# Patient Record
Sex: Male | Born: 1985 | Race: White | Hispanic: No | Marital: Single | Smoking: Current every day smoker
Health system: Southern US, Community
[De-identification: ages and names within clinical notes are randomized; demographics above are authoritative.]

## PROBLEM LIST (undated history)

## (undated) HISTORY — PX: TONSILLECTOMY: SUR1361

---

## 2010-12-14 ENCOUNTER — Emergency Department (HOSPITAL_COMMUNITY)
Admission: EM | Admit: 2010-12-14 | Discharge: 2010-12-14 | Disposition: A | Discharge status: Home / Self Care | Payer: Self-pay | Attending: Emergency Medicine | Admitting: Emergency Medicine

## 2010-12-14 DIAGNOSIS — W1809XA Striking against other object with subsequent fall, initial encounter: Secondary | ICD-10-CM | POA: Insufficient documentation

## 2010-12-14 DIAGNOSIS — R51 Headache: Secondary | ICD-10-CM | POA: Insufficient documentation

## 2010-12-14 DIAGNOSIS — R42 Dizziness and giddiness: Secondary | ICD-10-CM | POA: Insufficient documentation

## 2010-12-14 DIAGNOSIS — S0180XA Unspecified open wound of other part of head, initial encounter: Secondary | ICD-10-CM | POA: Insufficient documentation

## 2010-12-14 DIAGNOSIS — S0990XA Unspecified injury of head, initial encounter: Secondary | ICD-10-CM | POA: Insufficient documentation

## 2010-12-14 DIAGNOSIS — Y92009 Unspecified place in unspecified non-institutional (private) residence as the place of occurrence of the external cause: Secondary | ICD-10-CM | POA: Insufficient documentation

## 2010-12-14 DIAGNOSIS — S01501A Unspecified open wound of lip, initial encounter: Secondary | ICD-10-CM | POA: Insufficient documentation

## 2010-12-14 DIAGNOSIS — S0120XA Unspecified open wound of nose, initial encounter: Secondary | ICD-10-CM | POA: Insufficient documentation

## 2012-02-16 ENCOUNTER — Encounter (HOSPITAL_COMMUNITY): Payer: Self-pay | Admitting: Emergency Medicine

## 2012-02-16 ENCOUNTER — Emergency Department (HOSPITAL_COMMUNITY)
Admission: EM | Admit: 2012-02-16 | Discharge: 2012-02-16 | Disposition: A | Discharge status: Home / Self Care | Payer: Managed Care, Other (non HMO) | Attending: Emergency Medicine | Admitting: Emergency Medicine

## 2012-02-16 ENCOUNTER — Emergency Department (HOSPITAL_COMMUNITY): Payer: Managed Care, Other (non HMO)

## 2012-02-16 DIAGNOSIS — R110 Nausea: Secondary | ICD-10-CM | POA: Insufficient documentation

## 2012-02-16 DIAGNOSIS — H53149 Visual discomfort, unspecified: Secondary | ICD-10-CM | POA: Insufficient documentation

## 2012-02-16 DIAGNOSIS — R0602 Shortness of breath: Secondary | ICD-10-CM | POA: Insufficient documentation

## 2012-02-16 DIAGNOSIS — G43909 Migraine, unspecified, not intractable, without status migrainosus: Secondary | ICD-10-CM | POA: Insufficient documentation

## 2012-02-16 DIAGNOSIS — F17200 Nicotine dependence, unspecified, uncomplicated: Secondary | ICD-10-CM | POA: Insufficient documentation

## 2012-02-16 DIAGNOSIS — M7918 Myalgia, other site: Secondary | ICD-10-CM

## 2012-02-16 DIAGNOSIS — IMO0001 Reserved for inherently not codable concepts without codable children: Secondary | ICD-10-CM | POA: Insufficient documentation

## 2012-02-16 LAB — POCT I-STAT TROPONIN I: Troponin i, poc: 0.01 ng/mL (ref 0.00–0.08)

## 2012-02-16 MED ORDER — OXYCODONE-ACETAMINOPHEN 5-325 MG PO TABS: 1.0000 | ORAL_TABLET | ORAL | Status: AC | PRN

## 2012-02-16 MED ORDER — KETOROLAC TROMETHAMINE 30 MG/ML IJ SOLN
30.0000 mg | Freq: Once | INTRAMUSCULAR | Status: AC
  Administered 2012-02-16: 30 mg via INTRAVENOUS
  Filled 2012-02-16 (×2): qty 1

## 2012-02-16 MED ORDER — DIPHENHYDRAMINE HCL 50 MG/ML IJ SOLN
25.0000 mg | Freq: Once | INTRAMUSCULAR | Status: AC
  Administered 2012-02-16: 25 mg via INTRAVENOUS
  Filled 2012-02-16 (×2): qty 1

## 2012-02-16 MED ORDER — METOCLOPRAMIDE HCL 5 MG/ML IJ SOLN
10.0000 mg | Freq: Once | INTRAMUSCULAR | Status: AC
  Administered 2012-02-16: 10 mg via INTRAVENOUS
  Filled 2012-02-16: qty 2

## 2012-02-16 MED ORDER — IBUPROFEN 600 MG PO TABS: 600.0000 mg | ORAL_TABLET | Freq: Four times a day (QID) | ORAL | Status: AC | PRN

## 2012-02-16 NOTE — ED Provider Notes (Signed)
History     CSN: [id number]  Arrival date & time 02/16/12  5784   First MD Initiated Contact with Patient 02/16/12 1001      Chief Complaint  Patient presents with  . Chest Pain    (Consider location/radiation/quality/duration/timing/severity/associated sxs/prior treatment) Patient is a 25 y.o. male presenting with chest pain and migraines. The history is provided by the patient and a friend. No language interpreter was used.  Chest Pain The chest pain began 2 days ago. At its most intense, the pain is at 8/10. The pain is currently at 4/10. The quality of the pain is described as heavy, pressure-like and sharp. The pain does not radiate. Primary symptoms include shortness of breath and nausea. Pertinent negatives for primary symptoms include no fever, no cough, no abdominal pain and no vomiting.    Migraine This is a recurrent problem. The current episode started yesterday. The problem occurs constantly. The problem has been unchanged. Associated symptoms include chest pain and nausea. Pertinent negatives include no abdominal pain, congestion, coughing, fever, sore throat, swollen glands, visual change or vomiting. The symptoms are aggravated by bending. Treatments tried: Excedrin. The treatment provided mild relief.   26 year old male coming in with complaints of midsternal chest pain that is sharp and upper chest pain that is pressure-like x2 days and reproducible since he had the Heimlich maneuver performed on him with 3 compressions. Also complaining of paraspinal thoracic pain that is reproducible. States that he was at work and had a piece of bread stuck in his throat when his coworker performed the Heimlich.    Patient also complaining of a migraine headache typical to his migraines he said in the past. Pain is bilateral for head. He took Excedrin yesterday with no relief. He has photophobia and nausea today with his headache.  History reviewed. No pertinent past medical  history.  Past Surgical History  Procedure Date  . Tonsillectomy     No family history on file.  History  Substance Use Topics  . Smoking status: Current Every Day Smoker  . Smokeless tobacco: Not on file  . Alcohol Use: Yes      Review of Systems  Constitutional: Negative.  Negative for fever.  HENT: Negative.  Negative for congestion and sore throat.   Eyes: Positive for photophobia. Negative for visual disturbance.  Respiratory: Positive for shortness of breath. Negative for cough.   Cardiovascular: Positive for chest pain.  Gastrointestinal: Positive for nausea. Negative for vomiting and abdominal pain.  Neurological: Negative.   Psychiatric/Behavioral: Negative.   All other systems reviewed and are negative.    Allergies  Flexeril  Home Medications   Current Outpatient Rx  Name  Route  Sig  Dispense  Refill  . ASPIRIN-ACETAMINOPHEN-CAFFEINE 250-250-65 MG PO TABS   Oral   Take 2 tablets by mouth every 6 (six) hours as needed. Migraine           BP 134/70  Pulse 94  Temp 98.7 F (37.1 C) (Oral)  Resp 13  SpO2 100%  Physical Exam  Nursing note and vitals reviewed. Constitutional: He is oriented to person, place, and time. He appears well-developed and well-nourished.  HENT:  Head: Normocephalic.  Eyes: Conjunctivae normal and EOM are normal. Pupils are equal, round, and reactive to light.  Neck: Normal range of motion. Neck supple.  Cardiovascular: Normal rate.   Pulmonary/Chest: Effort normal and breath sounds normal. No respiratory distress. He has no wheezes. He has no rales.  Abdominal: Soft.  Musculoskeletal:  Normal range of motion. He exhibits tenderness.       Tenderness to the sternum and upper chest.  Tenderness to paraspinal thoracic  Neurological: He is alert and oriented to person, place, and time.  Skin: Skin is warm and dry.  Psychiatric: He has a normal mood and affect.    ED Course  Procedures (including critical care  time)  Labs Reviewed - No data to display No results found.   No diagnosis found.    MDM  Musculoskeletal pain after heimlich maneuver 2 days ago.  Also treated with migraine cocktail in the ER for his migraine h/a.  Patient better after meds in the ER. EKG unremarkable and trop -. Chest x-ray unremarkable and reviewed by myself.  Understands to return to the ER for severe pain or SOB.  He will follow up with his pcp as needed. rx for 6 percocet. Patient and friend are ready for discharge.     Date: 02/16/2012  Rate: 98  Rhythm: normal sinus rhythm  QRS Axis: right  Intervals: normal  ST/T Wave abnormalities: normal  Conduction Disutrbances:none  Narrative Interpretation:   Old EKG Reviewed: none available  Labs Reviewed  POCT I-STAT TROPONIN I          Remi Haggard, NP 02/16/12 1126

## 2012-02-16 NOTE — ED Notes (Signed)
Per patient, had himlic done on Monday, since then he has had increased sternal chest pain, as well as back pain

## 2012-02-18 NOTE — ED Provider Notes (Signed)
Medical screening examination/treatment/procedure(s) were performed by non-physician practitioner and as supervising physician I was immediately available for consultation/collaboration.  Somer Trotter R. Jakayla Schweppe, MD 02/18/12 1054 

## 2012-02-29 ENCOUNTER — Emergency Department: Payer: Self-pay | Admitting: Emergency Medicine

## 2012-02-29 LAB — HEPATIC FUNCTION PANEL A (ARMC)
Alkaline Phosphatase: 72 U/L (ref 50–136)
Bilirubin, Direct: 0.1 mg/dL (ref 0.00–0.20)
SGOT(AST): 35 U/L (ref 15–37)
SGPT (ALT): 30 U/L (ref 12–78)

## 2012-02-29 LAB — URINALYSIS, COMPLETE
Bacteria: NONE SEEN
Bilirubin,UR: NEGATIVE
Blood: NEGATIVE
Glucose,UR: NEGATIVE mg/dL (ref 0–75)
Ketone: NEGATIVE
Leukocyte Esterase: NEGATIVE
RBC,UR: 1 /HPF (ref 0–5)
Squamous Epithelial: 1

## 2012-02-29 LAB — CBC
HCT: 54.6 % — ABNORMAL HIGH (ref 40.0–52.0)
MCH: 31 pg (ref 26.0–34.0)
MCV: 92 fL (ref 80–100)
Platelet: 238 10*3/uL (ref 150–440)
RDW: 13.5 % (ref 11.5–14.5)

## 2012-02-29 LAB — BASIC METABOLIC PANEL
Anion Gap: 3 — ABNORMAL LOW (ref 7–16)
BUN: 13 mg/dL (ref 7–18)
Co2: 27 mmol/L (ref 21–32)
Creatinine: 1.06 mg/dL (ref 0.60–1.30)
EGFR (African American): >60
Osmolality: 278 (ref 275–301)

## 2012-02-29 LAB — DIFFERENTIAL
Basophil #: 0.1 10*3/uL (ref 0.0–0.1)
Basophil %: 0.6 %
Eosinophil #: 0.1 10*3/uL (ref 0.0–0.7)
Lymphocyte #: 0.5 10*3/uL — ABNORMAL LOW (ref 1.0–3.6)
Monocyte %: 2.1 %
Neutrophil #: 19.5 10*3/uL — ABNORMAL HIGH (ref 1.4–6.5)

## 2012-02-29 LAB — LIPASE, BLOOD: Lipase: 205 U/L (ref 73–393)

## 2012-04-11 LAB — COMPREHENSIVE METABOLIC PANEL
BUN: 15 mg/dL (ref 7–18)
Calcium, Total: 9.3 mg/dL (ref 8.5–10.1)
Co2: 23 mmol/L (ref 21–32)
Potassium: 3.7 mmol/L (ref 3.5–5.1)
SGPT (ALT): 20 U/L (ref 12–78)
Sodium: 139 mmol/L (ref 136–145)

## 2012-04-11 LAB — URINALYSIS, COMPLETE
Leukocyte Esterase: NEGATIVE
Nitrite: NEGATIVE
RBC,UR: 1 /HPF (ref 0–5)
Specific Gravity: 1.031 (ref 1.003–1.030)
Squamous Epithelial: NONE SEEN

## 2012-04-11 LAB — CBC
HCT: 51.7 % (ref 40.0–52.0)
HGB: 19 g/dL — ABNORMAL HIGH (ref 13.0–18.0)
MCHC: 36.7 g/dL — ABNORMAL HIGH (ref 32.0–36.0)
MCV: 88 fL (ref 80–100)
RDW: 13 % (ref 11.5–14.5)

## 2012-04-12 ENCOUNTER — Observation Stay: Payer: Self-pay | Admitting: Surgery

## 2012-04-13 LAB — COMPREHENSIVE METABOLIC PANEL
Anion Gap: 5 — ABNORMAL LOW (ref 7–16)
BUN: 7 mg/dL (ref 7–18)
Bilirubin,Total: 0.4 mg/dL (ref 0.2–1.0)
Calcium, Total: 8.2 mg/dL — ABNORMAL LOW (ref 8.5–10.1)
Chloride: 111 mmol/L — ABNORMAL HIGH (ref 98–107)
EGFR (African American): >60
EGFR (Non-African Amer.): >60
Glucose: 76 mg/dL (ref 65–99)
Potassium: 4.1 mmol/L (ref 3.5–5.1)
SGOT(AST): 22 U/L (ref 15–37)
Total Protein: 6.1 g/dL — ABNORMAL LOW (ref 6.4–8.2)

## 2012-04-13 LAB — CBC WITH DIFFERENTIAL/PLATELET
Eosinophil %: 9.6 %
Lymphocyte %: 49.1 %
MCHC: 35.7 g/dL (ref 32.0–36.0)
Monocyte #: 0.5 x10 3/mm (ref 0.2–1.0)
RDW: 12.9 % (ref 11.5–14.5)

## 2012-04-14 LAB — BASIC METABOLIC PANEL
Anion Gap: 5 — ABNORMAL LOW (ref 7–16)
Calcium, Total: 8.5 mg/dL (ref 8.5–10.1)

## 2012-04-14 LAB — CBC WITH DIFFERENTIAL/PLATELET
Basophil %: 1.3 %
Eosinophil #: 0.4 10*3/uL (ref 0.0–0.7)
HCT: 42.5 % (ref 40.0–52.0)
Lymphocyte %: 40.6 %
Monocyte #: 0.7 x10 3/mm (ref 0.2–1.0)
Monocyte %: 13.2 %
Neutrophil #: 2.1 10*3/uL (ref 1.4–6.5)
Neutrophil %: 38.3 %
Platelet: 164 10*3/uL (ref 150–440)
RBC: 4.78 10*6/uL (ref 4.40–5.90)

## 2012-04-15 ENCOUNTER — Emergency Department: Payer: Self-pay | Admitting: Emergency Medicine

## 2012-04-16 LAB — CBC
HCT: 47.9 % (ref 40.0–52.0)
MCH: 30.4 pg (ref 26.0–34.0)
MCHC: 34.4 g/dL (ref 32.0–36.0)
MCV: 88 fL (ref 80–100)
Platelet: 195 10*3/uL (ref 150–440)
RBC: 5.41 10*6/uL (ref 4.40–5.90)
RDW: 12.8 % (ref 11.5–14.5)

## 2012-04-16 LAB — URINALYSIS, COMPLETE
Bilirubin,UR: NEGATIVE
Blood: NEGATIVE
Glucose,UR: NEGATIVE mg/dL (ref 0–75)
Ketone: NEGATIVE
Nitrite: NEGATIVE
Protein: NEGATIVE
RBC,UR: 1 /HPF (ref 0–5)
Specific Gravity: 1.02 (ref 1.003–1.030)
Squamous Epithelial: 2

## 2012-04-16 LAB — BASIC METABOLIC PANEL
Chloride: 104 mmol/L (ref 98–107)
Co2: 31 mmol/L (ref 21–32)
Creatinine: 1.28 mg/dL (ref 0.60–1.30)
EGFR (African American): >60
EGFR (Non-African Amer.): >60
Osmolality: 278 (ref 275–301)
Potassium: 4.4 mmol/L (ref 3.5–5.1)
Sodium: 140 mmol/L (ref 136–145)

## 2012-04-16 LAB — DRUG SCREEN, URINE
Cannabinoid 50 Ng, Ur ~~LOC~~: POSITIVE (ref ?–50)
Cocaine Metabolite,Ur ~~LOC~~: POSITIVE (ref ?–300)
MDMA (Ecstasy)Ur Screen: NEGATIVE (ref ?–500)
Opiate, Ur Screen: POSITIVE (ref ?–300)

## 2012-07-09 ENCOUNTER — Emergency Department: Payer: Self-pay | Admitting: Emergency Medicine

## 2012-07-09 LAB — URINALYSIS, COMPLETE
Bacteria: NONE SEEN
Bilirubin,UR: NEGATIVE
Blood: NEGATIVE
Ketone: NEGATIVE
Protein: NEGATIVE
RBC,UR: <1 /HPF (ref 0–5)
Specific Gravity: 1.008 (ref 1.003–1.030)

## 2012-07-09 LAB — COMPREHENSIVE METABOLIC PANEL
Alkaline Phosphatase: 58 U/L (ref 50–136)
Bilirubin,Total: 0.4 mg/dL (ref 0.2–1.0)
Chloride: 108 mmol/L — ABNORMAL HIGH (ref 98–107)
SGPT (ALT): 16 U/L (ref 12–78)
Total Protein: 7.1 g/dL (ref 6.4–8.2)

## 2012-07-09 LAB — CBC
HGB: 15.4 g/dL (ref 13.0–18.0)
MCV: 90 fL (ref 80–100)
Platelet: 183 10*3/uL (ref 150–440)
RBC: 4.82 10*6/uL (ref 4.40–5.90)

## 2012-07-09 LAB — LIPASE, BLOOD: Lipase: 152 U/L (ref 73–393)

## 2012-08-01 ENCOUNTER — Emergency Department: Payer: Self-pay | Admitting: Emergency Medicine

## 2012-08-01 LAB — URINALYSIS, COMPLETE
Glucose,UR: NEGATIVE mg/dL (ref 0–75)
Nitrite: NEGATIVE
Protein: NEGATIVE
RBC,UR: NONE SEEN /HPF (ref 0–5)
Specific Gravity: 1.025 (ref 1.003–1.030)
Squamous Epithelial: <1
WBC UR: 69 /HPF (ref 0–5)

## 2012-08-21 ENCOUNTER — Emergency Department: Payer: Self-pay | Admitting: Unknown Physician Specialty

## 2012-08-21 LAB — URINALYSIS, COMPLETE
Bilirubin,UR: NEGATIVE
Glucose,UR: NEGATIVE mg/dL (ref 0–75)
Ketone: NEGATIVE
Nitrite: NEGATIVE
Protein: NEGATIVE
Specific Gravity: 1.01 (ref 1.003–1.030)
WBC UR: 11 /HPF (ref 0–5)

## 2012-08-21 LAB — LIPASE, BLOOD: Lipase: 563 U/L — ABNORMAL HIGH (ref 73–393)

## 2012-08-21 LAB — BASIC METABOLIC PANEL
BUN: 11 mg/dL (ref 7–18)
Calcium, Total: 8.9 mg/dL (ref 8.5–10.1)
Creatinine: 1.04 mg/dL (ref 0.60–1.30)
EGFR (African American): >60
EGFR (Non-African Amer.): >60
Glucose: 97 mg/dL (ref 65–99)
Osmolality: 277 (ref 275–301)
Potassium: 4 mmol/L (ref 3.5–5.1)
Sodium: 139 mmol/L (ref 136–145)

## 2012-08-21 LAB — CBC
HGB: 15.7 g/dL (ref 13.0–18.0)
Platelet: 193 10*3/uL (ref 150–440)
RBC: 5.01 10*6/uL (ref 4.40–5.90)

## 2012-08-21 LAB — HEPATIC FUNCTION PANEL A (ARMC)
Bilirubin, Direct: <0.1 mg/dL (ref 0.00–0.20)
Bilirubin,Total: 0.3 mg/dL (ref 0.2–1.0)
SGOT(AST): 24 U/L (ref 15–37)

## 2012-08-21 LAB — MAGNESIUM: Magnesium: 1.7 mg/dL — ABNORMAL LOW

## 2012-09-13 ENCOUNTER — Emergency Department: Payer: Self-pay | Admitting: Emergency Medicine

## 2012-09-13 LAB — COMPREHENSIVE METABOLIC PANEL
Bilirubin,Total: 0.4 mg/dL (ref 0.2–1.0)
Calcium, Total: 9.1 mg/dL (ref 8.5–10.1)
Chloride: 106 mmol/L (ref 98–107)
Creatinine: 1.17 mg/dL (ref 0.60–1.30)
EGFR (African American): >60
EGFR (Non-African Amer.): >60
Osmolality: 280 (ref 275–301)
SGOT(AST): 20 U/L (ref 15–37)
Total Protein: 7.4 g/dL (ref 6.4–8.2)

## 2012-09-13 LAB — LIPASE, BLOOD: Lipase: 168 U/L (ref 73–393)

## 2012-09-13 LAB — URINALYSIS, COMPLETE
Glucose,UR: NEGATIVE mg/dL (ref 0–75)
Ketone: NEGATIVE
Ph: 5 (ref 4.5–8.0)
Protein: NEGATIVE
RBC,UR: 17 /HPF (ref 0–5)
Specific Gravity: 1.029 (ref 1.003–1.030)
Squamous Epithelial: 2
WBC UR: 112 /HPF (ref 0–5)

## 2012-09-13 LAB — CBC
HCT: 48.5 % (ref 40.0–52.0)
HGB: 16.8 g/dL (ref 13.0–18.0)
MCH: 31 pg (ref 26.0–34.0)
MCV: 89 fL (ref 80–100)
Platelet: 203 10*3/uL (ref 150–440)
RBC: 5.43 10*6/uL (ref 4.40–5.90)
WBC: 12.5 10*3/uL — ABNORMAL HIGH (ref 3.8–10.6)

## 2012-12-14 ENCOUNTER — Emergency Department: Payer: Self-pay | Admitting: Emergency Medicine

## 2012-12-14 LAB — COMPREHENSIVE METABOLIC PANEL
Co2: 28 mmol/L (ref 21–32)
Creatinine: 0.97 mg/dL (ref 0.60–1.30)
EGFR (African American): >60
EGFR (Non-African Amer.): >60
Potassium: 4.2 mmol/L (ref 3.5–5.1)
Sodium: 139 mmol/L (ref 136–145)

## 2012-12-14 LAB — URINALYSIS, COMPLETE
Bilirubin,UR: NEGATIVE
Blood: NEGATIVE
Glucose,UR: NEGATIVE mg/dL (ref 0–75)
Ketone: NEGATIVE
Nitrite: NEGATIVE
Ph: 6 (ref 4.5–8.0)
Specific Gravity: 1.023 (ref 1.003–1.030)
WBC UR: 94 /HPF (ref 0–5)

## 2012-12-14 LAB — CBC
HCT: 45.3 % (ref 40.0–52.0)
MCHC: 34.6 g/dL (ref 32.0–36.0)
Platelet: 209 10*3/uL (ref 150–440)
RDW: 13.7 % (ref 11.5–14.5)

## 2012-12-16 LAB — URINE CULTURE

## 2013-02-12 ENCOUNTER — Emergency Department: Payer: Self-pay | Admitting: Emergency Medicine

## 2013-07-20 ENCOUNTER — Emergency Department: Payer: Self-pay | Admitting: Emergency Medicine

## 2013-07-20 LAB — URINALYSIS, COMPLETE
Bilirubin,UR: NEGATIVE
Blood: NEGATIVE
Glucose,UR: NEGATIVE mg/dL (ref 0–75)
Ketone: NEGATIVE
Nitrite: NEGATIVE
Ph: 5 (ref 4.5–8.0)
Protein: NEGATIVE
RBC,UR: 2 /HPF (ref 0–5)
SPECIFIC GRAVITY: 1.018 (ref 1.003–1.030)
WBC UR: 10 /HPF (ref 0–5)

## 2013-08-27 IMAGING — CT CT ABD-PELV W/ CM
1 of 2 series · 15 of 32 positions shown, 19 images · non-contrast
Comparison: none

REASON FOR EXAM: (1) ABD PAIN, VOMITING; (2) H/O INTUSSECEPTION;    NOTE:
Nursing to Give Oral CT
COMMENTS:   May transport without cardiac monitor

[Series 2: 3mm soft tissue · axial · 0.68mm/px · z∈[-546,-128]mm · 15 of 153 slices shown, 19 images]
[im 7/153  soft-tissue]
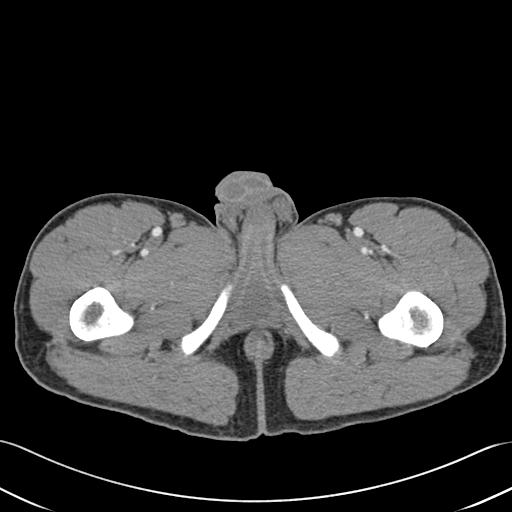
[im 7/153  bone]
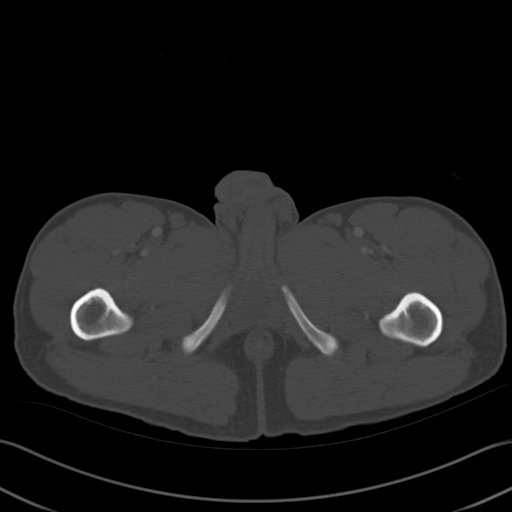
[im 20/153  soft-tissue]
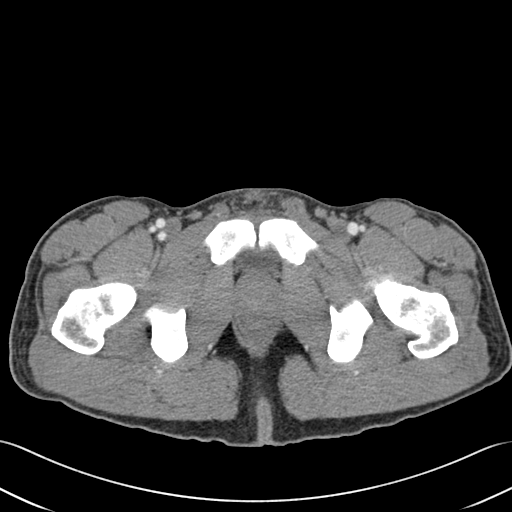
[im 34/153  soft-tissue]
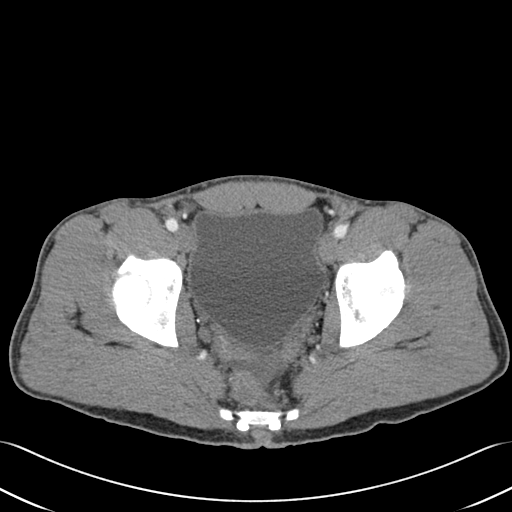
[im 40/153  soft-tissue]
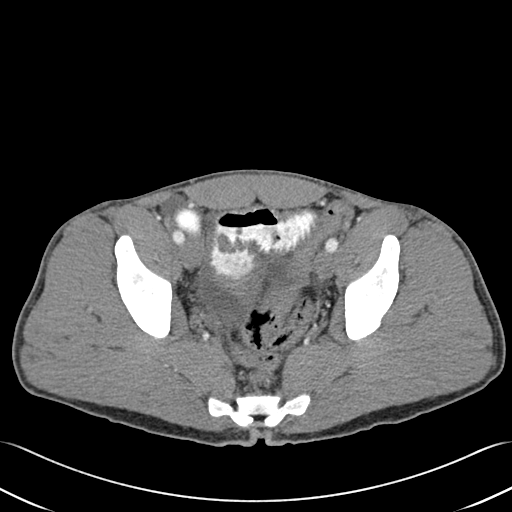
[im 53/153  soft-tissue]
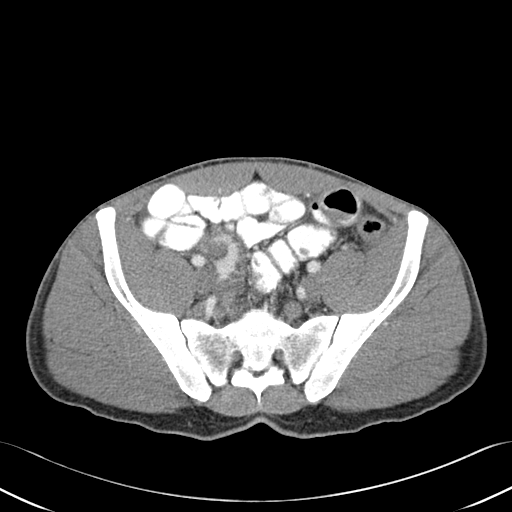
[im 67/153  soft-tissue]
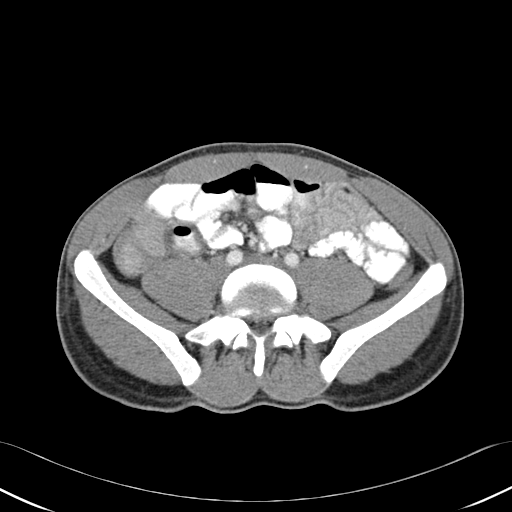
[im 80/153  soft-tissue]
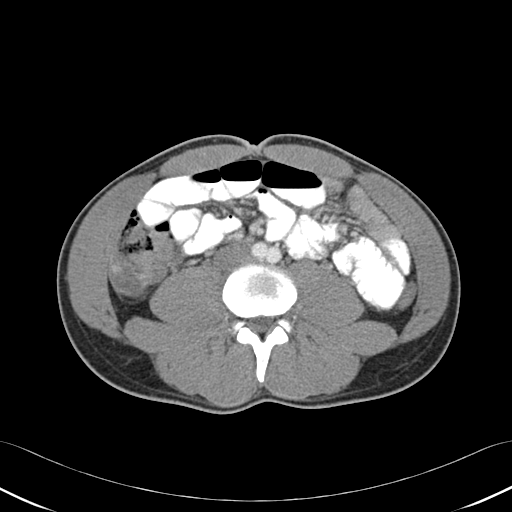
[im 86/153  soft-tissue]
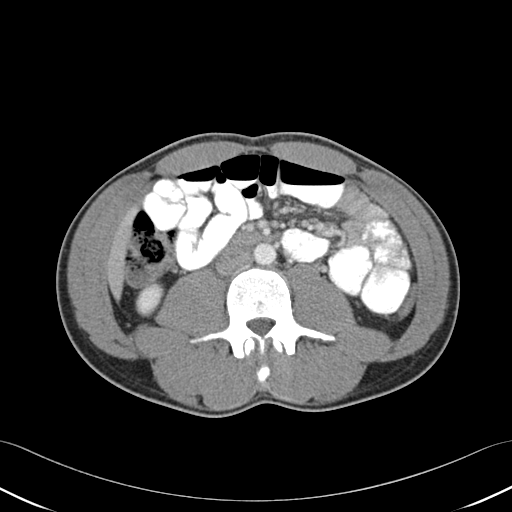
[im 100/153  soft-tissue]
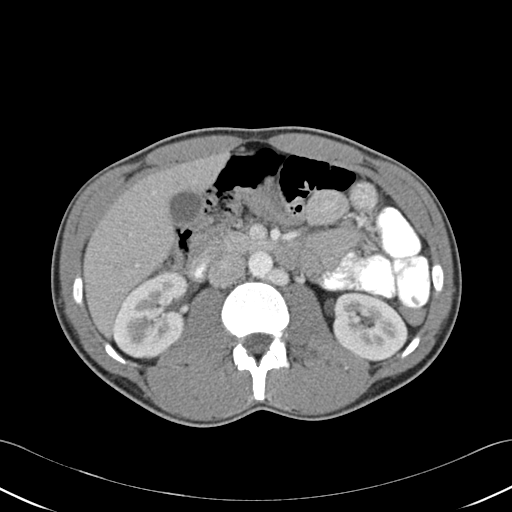
[im 100/153  bone]
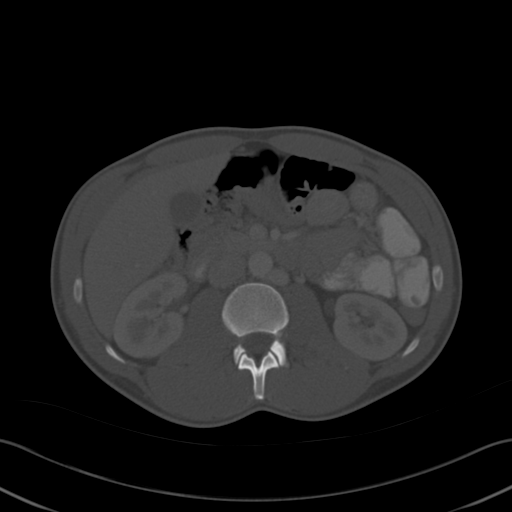
[im 113/153  soft-tissue]
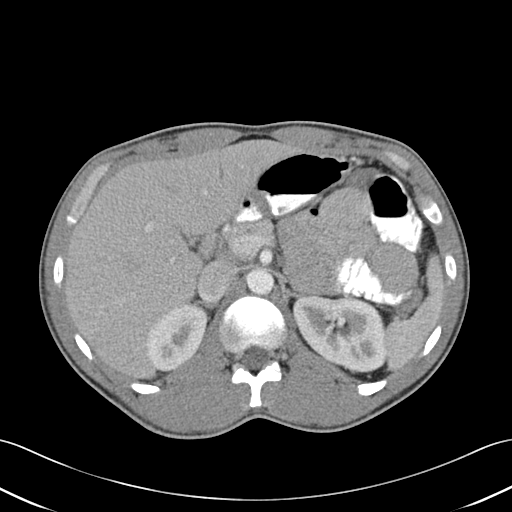
[im 119/153  soft-tissue]
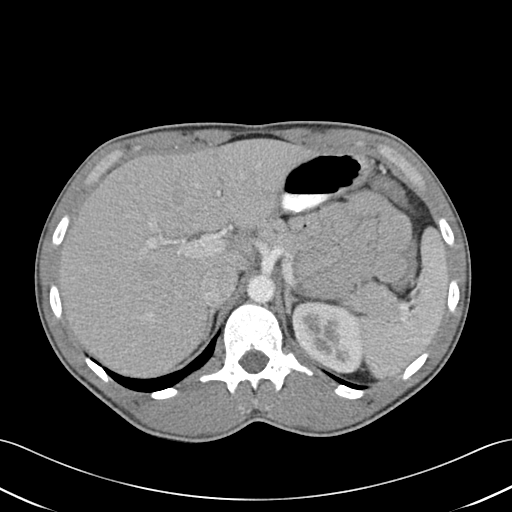
[im 126/153  lung]
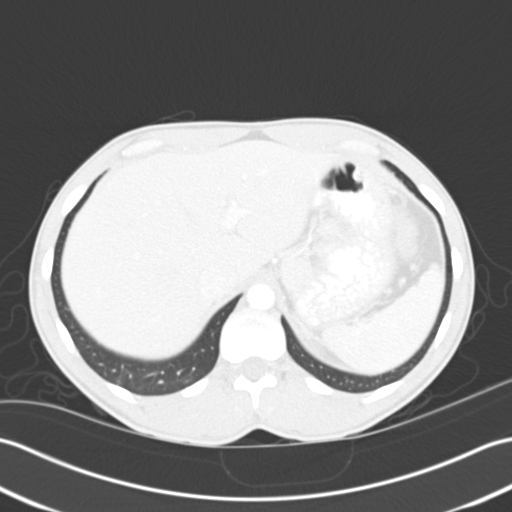
[im 133/153  soft-tissue]
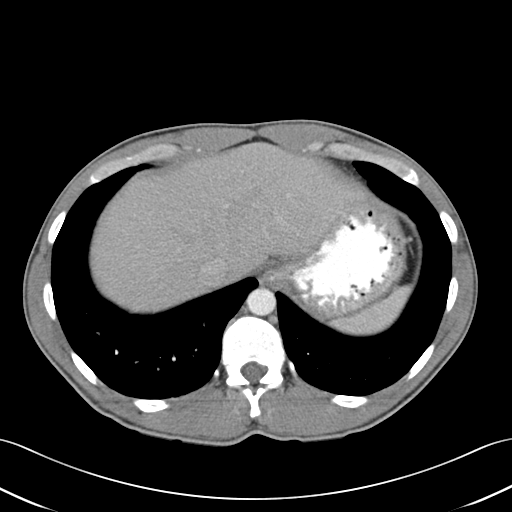
[im 133/153  lung]
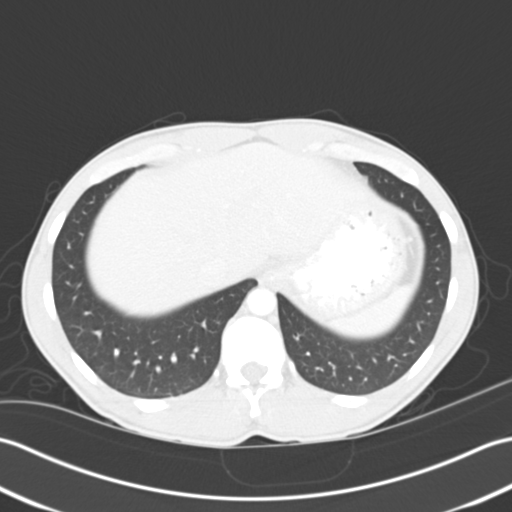
[im 139/153  lung]
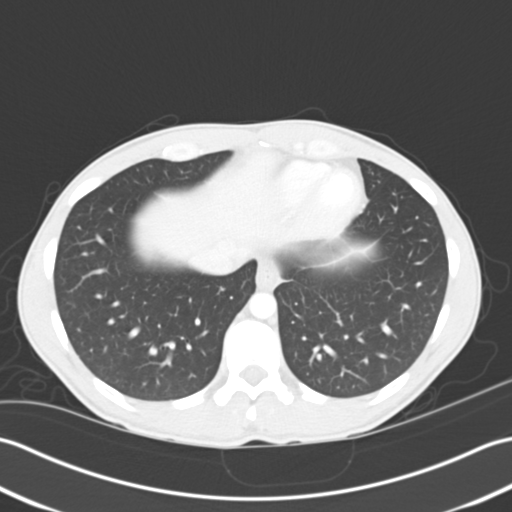
[im 146/153  soft-tissue]
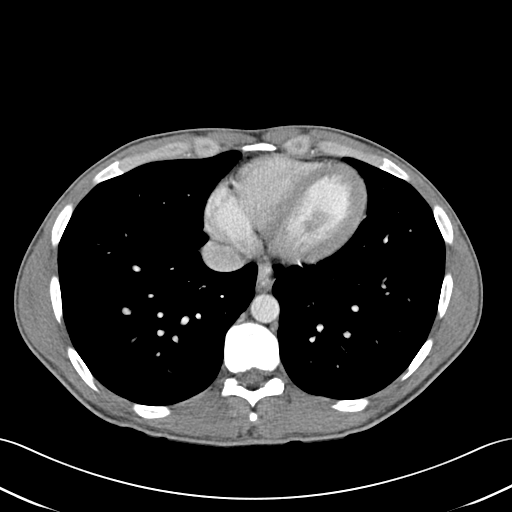
[im 146/153  lung]
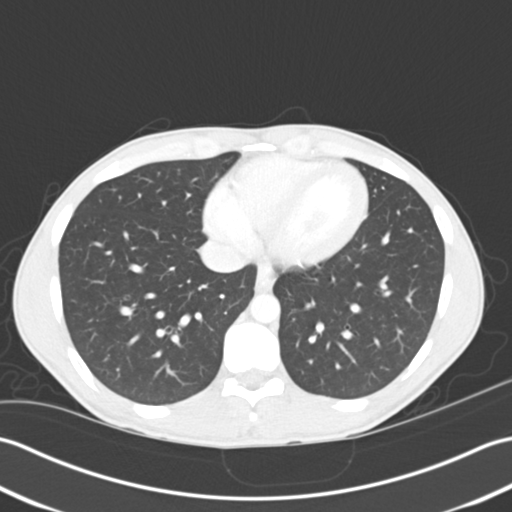

[15 of 32 positions shown; findings below may reference images not displayed]

PROCEDURE:     CT  - CT ABDOMEN / PELVIS  W  - July 09, 2012  [DATE]

RESULT:     Axial CT scanning was performed through the abdomen and pelvis
with reconstructions at 3 mm intervals and slice thicknesses. The patient
received 100 cc of Fsovue-[id number] and also received oral contrast material.
Comparison is made to the study dated April 12, 2012.

There is mild distention of small bowel loops with the orally administered
contrast. The distal small bowel however demonstrates thickening of its wall
an irregular fashion. There is a small amount of fecal material floating
within the small bowel. The pattern of small bowel intussusception
demonstrated in April 2012 is not seen today. The colon is relatively
collapsed. There is a small amount of free fluid in the pelvis.

The liver, gallbladder, spleen, partially distended stomach, adrenal glands,
pancreas, and kidneys are normal in appearance. The caliber of the abdominal
aorta is normal. The urinary bladder is moderately distended. There is no
inguinal nor umbilical hernia.

The lung bases are clear. The lumbar vertebral bodies are preserved in
height.
IMPRESSION: 1. There is thickening of the wall of the distal small bowel. A classic
intussusception pattern which was previously demonstrated is not
demonstrated today. The pattern today suggests a process such as
inflammatory bowel disease. There is no high-grade obstruction, but the
loops of proximal and mid small bowel are mildly distended with the orally
administered contrast. The colon is collapsed. There are surgical clips
associated with the cecum presumably from previous appendectomy.
2. There is no acute hepatobiliary abnormality nor acute urinary tract
abnormality.
3. There is a trace of free fluid in the pelvis. No bulky intra-abdominal or
pelvic lymph nodes are demonstrated.

[REDACTED]

## 2013-09-19 IMAGING — CR DG WRIST COMPLETE 3+V*R*
1 series · 4 of 4 positions shown · non-contrast
Comparison: none

REASON FOR EXAM: Wrist pain after fall
COMMENTS:

[Series 1: pa · 0.17mm/px · 4 of 4 slices shown]
[im 1/4]
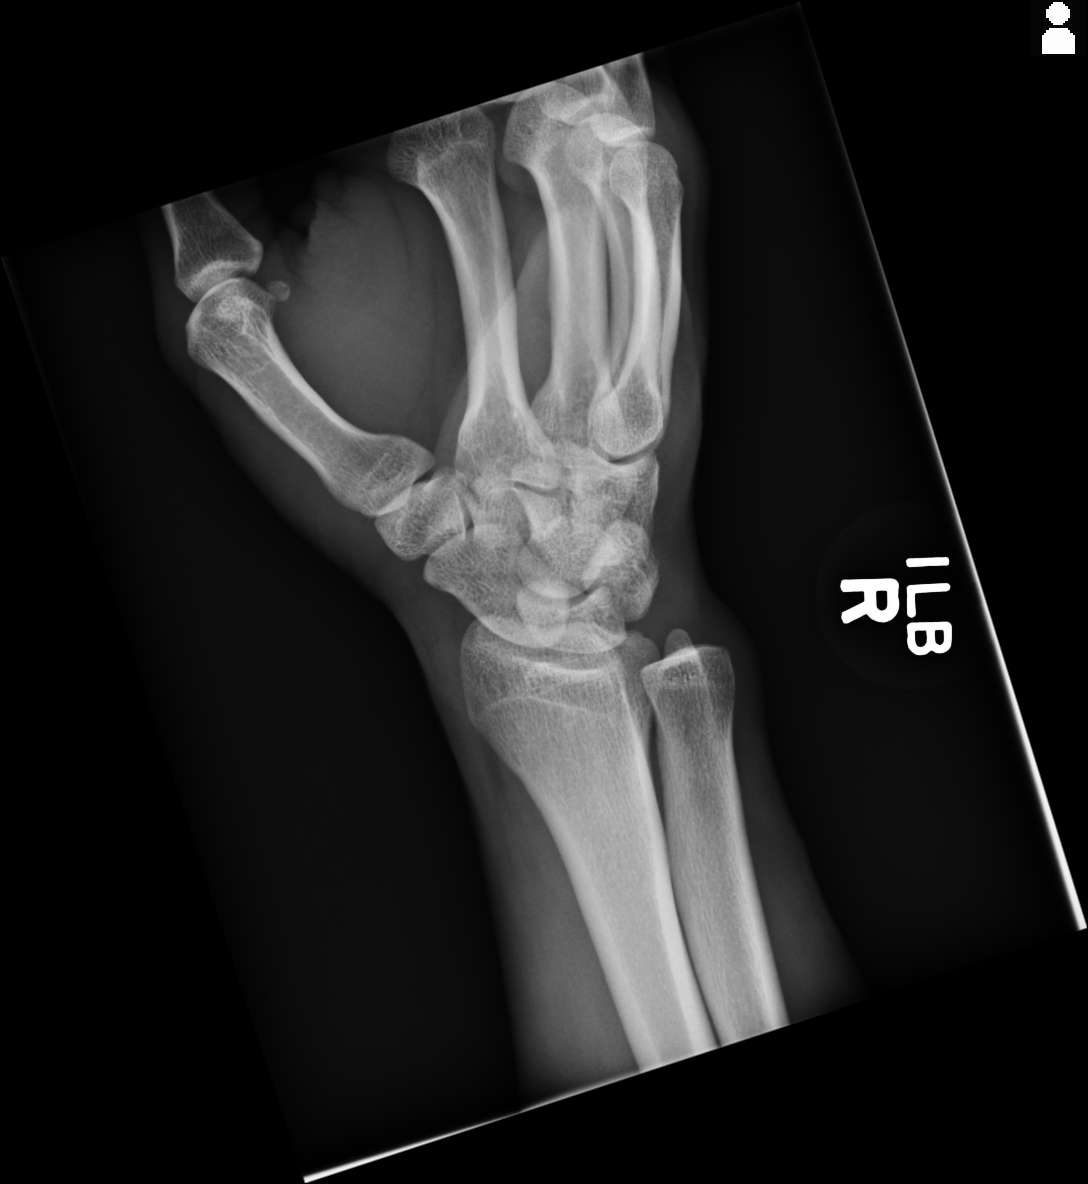
[im 2/4]
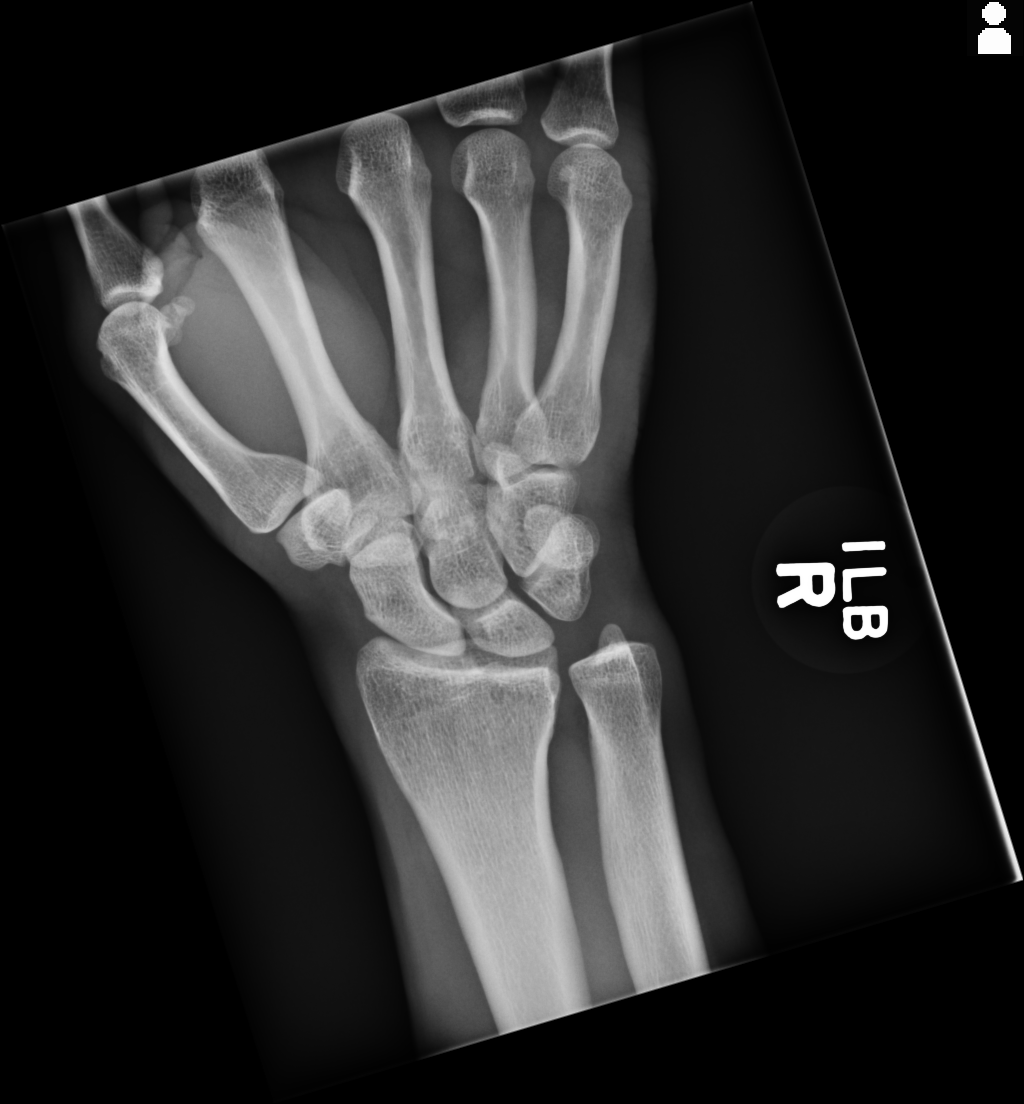
[im 3/4]
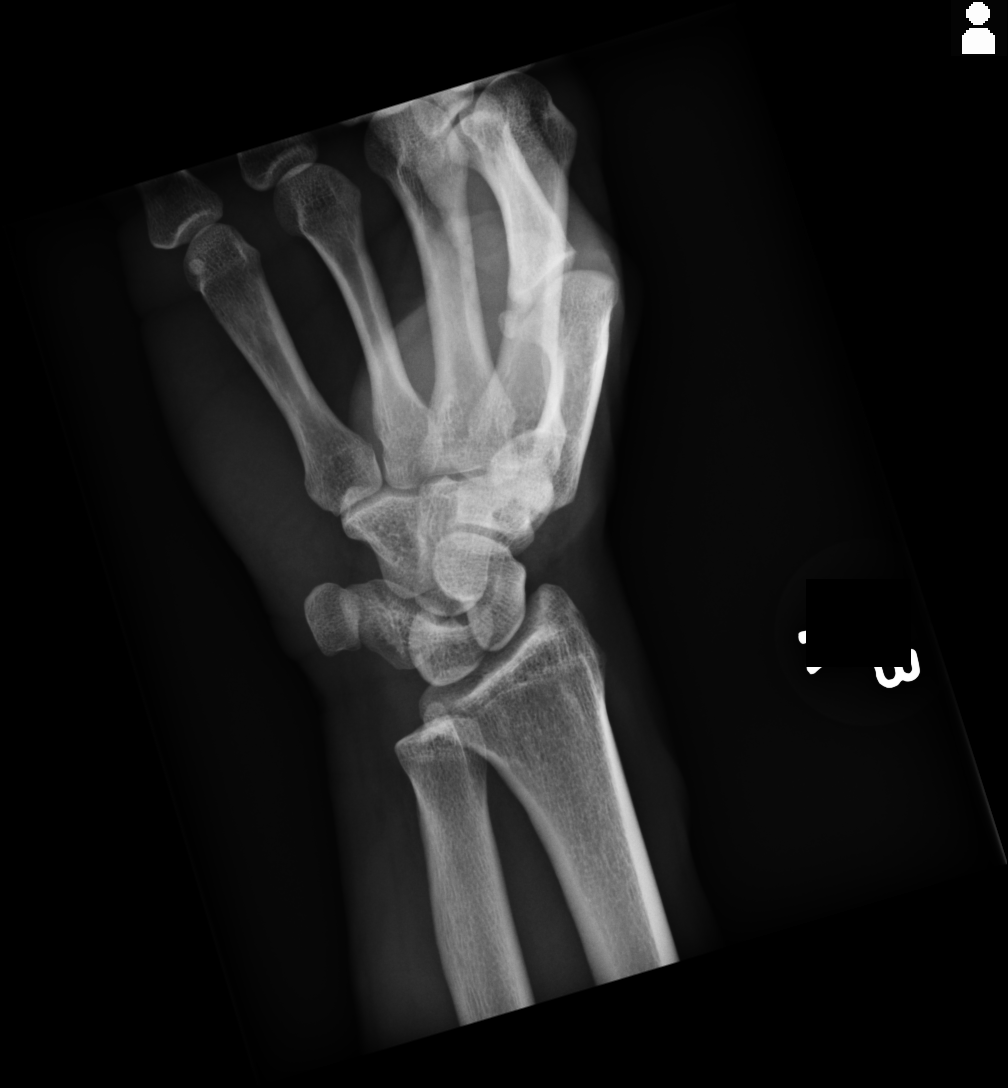
[im 4/4]
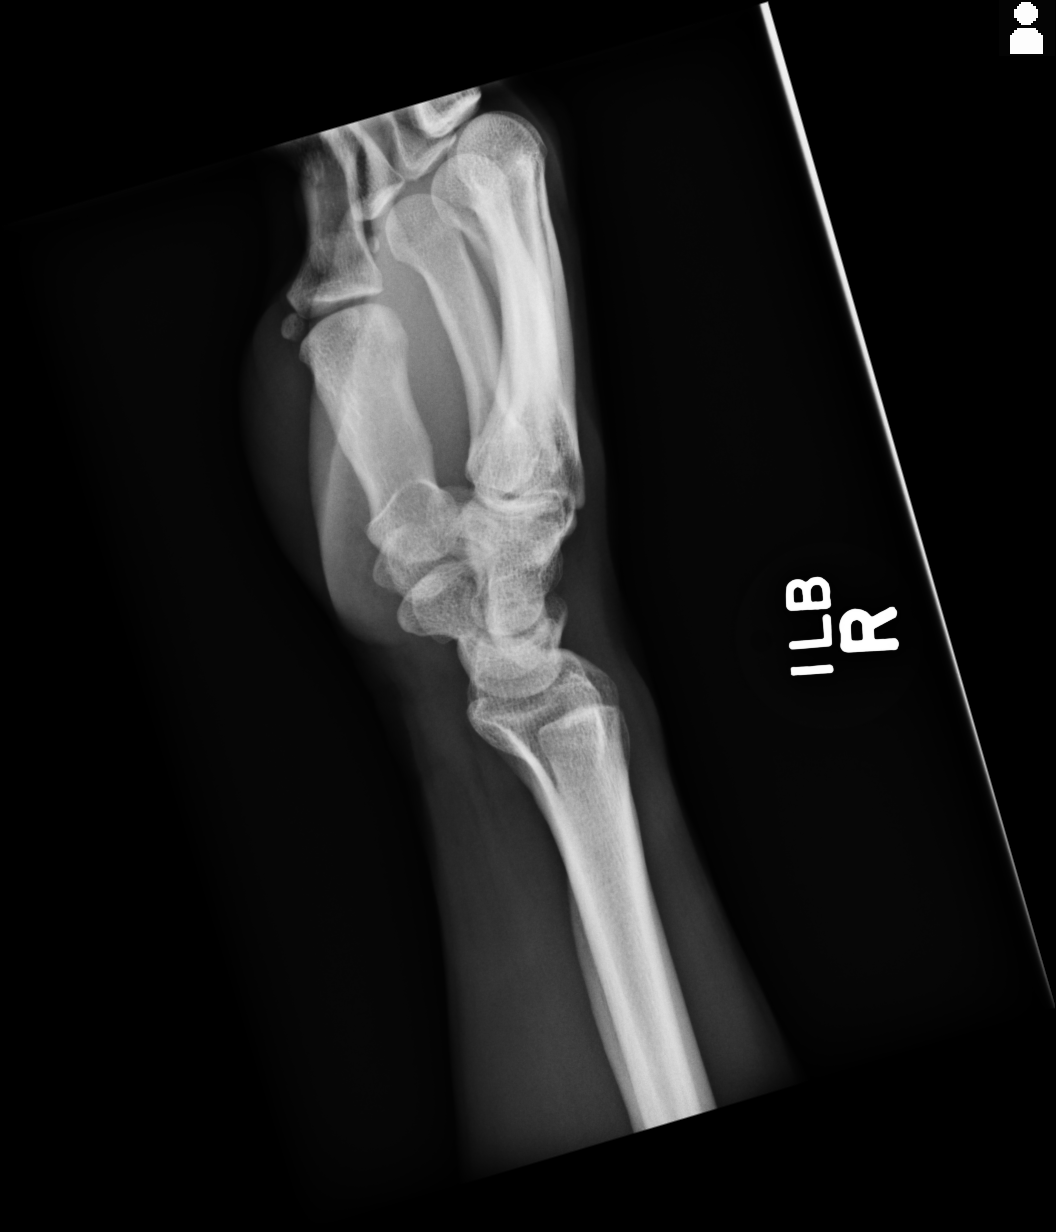

[4 of 4 positions shown; findings below may reference images not displayed]

PROCEDURE:     DXR - DXR WRIST RT COMP WITH OBLIQUES  - August 01, 2012  [DATE]

RESULT:     Four views of the right wrist reveal the bones to be adequately
mineralized. There is no evidence of an acute fracture. The distal radius
and ulna, the carpal bones, and the visualized portions of the metacarpals
appear intact. The overlying soft tissues are normal in appearance. No
significant degenerative change is demonstrated.
IMPRESSION: There is no acute bony abnormality of the right wri[REDACTED]

## 2014-07-23 NOTE — Consult Note (Signed)
PATIENT NAME:  Marcus [name], Marcus [name] MR#:  [id number] DATE OF BIRTH:  1986-02-26  DATE OF CONSULTATION:  02/29/2012  REFERRING PHYSICIAN:   CONSULTING PHYSICIAN:  Adah Salvageichard E. Excell Seltzerooper, MD  CHIEF COMPLAINT: Back pain.   HISTORY OF PRESENT ILLNESS: This is a patient who presents with back pain and right lower quadrant pain, actually back pain radiating into the right lower quadrant. This started this morning. He was fine yesterday. He has had 45 watery diarrheal stools but none since being at the hospital. He had multiple emeses and he thinks he had low-grade fevers and chills. No jaundice or acholic stools, no melena or hematochezia. He has never had an episode like this before. Of note, his wife states that she had similar symptoms with cramping lower abdominal pain and diarrhea, but no nausea or vomiting and no back pain two days ago. No one else in the family has been ill, however.   PAST MEDICAL HISTORY: None.   PAST SURGICAL HISTORY:  1. 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 2. Tonsillectomy.  3. Adenoidectomy.  4. 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   ALLERGIES: Cyclobenzaprine.   MEDICATIONS: None.   SOCIAL HISTORY: The patient is currently unemployed. He works in [employer]. He smokes tobacco, 1 pack of cigarettes per day. Does not drink alcohol regularly.  REVIEW OF SYSTEMS: 10-system review is performed and negative with the exception of that mentioned in the history of present illness.   PHYSICAL EXAMINATION:  GENERAL: Healthy, thin, Caucasian male patient with a BMI of 21.   VITAL SIGNS: Temperature 96, pulse 80, respirations 14,  blood pressure 122/78, room air sats 98. Pain scale of 4. It was as high as 10.   HEENT:  No scleral icterus.  NECK: No palpable neck nodes.   CHEST: Clear to auscultation.   CARDIAC: Regular rate and rhythm.   ABDOMEN: Soft, scaphoid, nondistended, non-tympanitic, and essentially minimally tender diffusely. No  maximal tenderness in the right lower quadrant, and in fact deep palpation into the iliac fossa resulted in no tenderness to speak of. He has some mild bilateral CVA tenderness and certainly no peritoneal signs.   EXTREMITIES: Without edema. Calves are nontender.   NEUROLOGIC: Grossly intact.   INTEGUMENT: No jaundice.  CT scan is personally reviewed. Appendix is not well visualized. No inflammation in the right lower quadrant. White blood cell count is 21, hemoglobin and hematocrit 18.5 and 54.6, platelet count 238. Electrolytes are within normal limits. Urinalysis is clean.   ASSESSMENT AND PLAN: This is a patient with back pain radiating to his right lower quadrant. He has no signs of right lower quadrant tenderness to speak of and certainly has no peritoneal signs. He has extensive nausea and vomiting and his wife had a similar episode two days ago making one suspect either viral or bacterial gastroenteritis. I discussed with Dr. Darnelle CatalanMalinda the patient's disposition and I do not believe that he needs to come into the hospital at this point. Appendicitis risk is very low at this point and I suggested that he follow up on an as-needed basis if his pain returns or worsens. Dr. Darnelle CatalanMalinda will handle disposition of this patient.  ____________________________ Adah Salvageichard E. Excell Seltzerooper, MD rec:bjt [name]:  02/29/2012 20:53:21 ET          T: 03/01/2012 09:38:59 ET         JOB#: [id number]   Lattie HawICHARD E Adiana Smelcer MD ELECTRONICALLY SIGNED 03/10/2012 20:32

## 2014-07-23 NOTE — Consult Note (Signed)
Brief Consult Note: Diagnosis: gastroenteritris.   Patient was seen by consultant.   Consult note dictated.   Recommend further assessment or treatment.   Discussed with Attending MD.   Comments: no peritoneal signs. N/V and diarrhea, wife had similar sx 2 d ago.  rec dc home. discussed with ED MD.  Electronic Signatures: Lattie Hawooper, Melicia Esqueda E (MD)  (Signed [phone]-Nov-13 20:41)  Authored: Brief Consult Note   Last Updated: 26-Nov-13 20:41 by Lattie Hawooper, Latessa Tillis E (MD)

## 2014-07-26 NOTE — Consult Note (Signed)
Chief Complaint:   Subjective/Chief Complaint RLQ abd pain improving. No nausea/vomiting. Some diarrhea. SB series neg.   VITAL SIGNS/ANCILLARY NOTES: **Vital Signs.:   11-Jan-14 05:40   Vital Signs Type Routine   Temperature Temperature (F) 97.6   Celsius 36.4   Temperature Source Oral   Pulse Pulse 72   Respirations Respirations 18   Systolic BP Systolic BP 416   Diastolic BP (mmHg) Diastolic BP (mmHg) 71   Mean BP 85   Pulse Ox % Pulse Ox % 96   Pulse Ox Activity Level  At rest   Oxygen Delivery Room Air/ 21 %   Brief Assessment:   Cardiac Regular    Respiratory clear BS    Gastrointestinal RLQ tenderness   Lab Results: Routine Chem:  10-Jan-14 05:02    Glucose, Serum 79   BUN  6   Creatinine (comp) 1.03   Sodium, Serum 141   Potassium, Serum 4.1   Chloride, Serum  108   CO2, Serum 28   Calcium (Total), Serum 8.5   Anion Gap  5   Osmolality (calc) 278   eGFR (African American) >60   eGFR (Non-African American) >60 (eGFR values <59mL/min/1.73 m2 may be an indication of chronic kidney disease (CKD). Calculated eGFR is useful in patients with stable renal function. The eGFR calculation will not be reliable in acutely ill patients when serum creatinine is changing rapidly. It is not useful in  patients on dialysis. The eGFR calculation may not be applicable to patients at the low and high extremes of body sizes, pregnant women, and vegetarians.)  Routine Hem:  10-Jan-14 05:02    WBC (CBC) 5.6   RBC (CBC) 4.78   Hemoglobin (CBC) 14.8   Hematocrit (CBC) 42.5   Platelet Count (CBC) 164   MCV 89   MCH 31.0   MCHC 34.9   RDW 12.8   Neutrophil % 38.3   Lymphocyte % 40.6   Monocyte % 13.2   Eosinophil % 6.6   Basophil % 1.3   Neutrophil # 2.1   Lymphocyte # 2.3   Monocyte # 0.7   Eosinophil # 0.4   Basophil # 0.1 (Result(s) reported on 14 Apr 2012 at 05:57AM.)   Radiology Results: XRay:    10-Jan-14 10:07, Upper GI With Small Bowel   Upper GI With  Small Bowel    REASON FOR EXAM:    find intussusception lead point  COMMENTS:       PROCEDURE: FL  - FL UPPER GI WITH SMALL BOWEL  - Apr 14 2012 10:07AM     RESULT:     Procedure: The patient was given effervescent crystals followed by   administration of oral barium. The upper gastrointestinal tract was   evaluated. The barium column was followed through the small bowel status   post repeated administration of oral barium. The small bowel was   evaluated.    Findings: Limited evaluation of the esophagus is unremarkable. There is   appropriate relaxation of the lower esophageal sphincter. The stomach,     duodenum and duodenal bulb were unremarkable. The duodenum demonstrates   appropriate rotation and placement of the ligament of Treitz. There is no   evidence of gastroesophageal reflux.    Scout imaging of the abdomen was obtained prior to the study which   demonstrates air within nondilated loops of large and small bowel.    Contrast is appreciated within the small bowel with transit time to the   terminal ileum of  90 minutes. Compression focal evaluation of the small   bowel demonstrates no evidence of filling defects or regions of focal   narrowing. There is no evidence of focal out-pouchings. The terminal   ileum is appreciated and distends and contracts appropriately and appears   unremarkable. The small bowel is otherwise unremarkable.    IMPRESSION:  Unremarkable upper GI and small bowel follow-through as     described above.      Thank you for this opportunity to contribute to the careof your patient.         Verified By: Mikki Santee, M.D., MD   Assessment/Plan:  Assessment/Plan:   Assessment RLQ abd pain and poss intussusception. SB series neg.-    Plan Agree with discharge if tolerates food. Discussed arranging video capsule study as outpt to further evaluate distal SB. Pt willing. If abnormality found in terminal ileum, perhaps colonoscopy with  terminal ileal intubation can be arranged later. Thanks   Electronic Signatures: Verdie Shire (MD)  (Signed 11-Jan-14 08:57)  Authored: Chief Complaint, VITAL SIGNS/ANCILLARY NOTES, Brief Assessment, Lab Results, Radiology Results, Assessment/Plan   Last Updated: 11-Jan-14 08:57 by Verdie Shire (MD)

## 2014-07-26 NOTE — Consult Note (Signed)
PATIENT NAME:  Marcus [name], Marcus [name] MR#:  [id number] DATE OF BIRTH:  [birth year]  DATE OF CONSULTATION:  04/14/2012  REFERRING PHYSICIAN:   CONSULTING PHYSICIAN:  Ezzard Standing. Bluford Kaufmann, MD  REASON FOR REFERRAL: Intussusception.   HISTORY OF PRESENT ILLNESS:  The patient is a 29 year old white male who presented to the hospital on January 8th with significant right lower quadrant pain associated with nausea, vomiting and diarrhea that started on January 7th. He had a similar episode back in November. At that time, a CT scan was negative and the patient gradually got better and was sent home.   According to the patient, the patient has had to deal with these recurrent symptoms since childhood where he would have severe bouts of pain and nausea and vomiting. He was always told that he had viral gastroenteritis which resolved on its own. Nevertheless, this time CT scan of the abdomen showed evidence of intussusception effecting the distal small intestine. As a result, the patient was admitted for evaluation and possible treatment.   Even though overall he is feeling better and the nausea and vomiting is gone, he still has a fair amount of pain in the right lower quadrant area. He also has some diarrhea as well. He may have seen a little bit of blood yesterday.   PAST MEDICAL HISTORY: There is no other past medical history.   PAST SURGICAL HISTORY:  1. Neck surgery. 2. Tonsillectomy.  3. Toe amputations.   ALLERGIES: FLEXERIL AND ZOFRAN AND MORPHINE.  MEDICATIONS: He is on no medications at home.  SOCIAL HISTORY:  He smokes cigarettes, but denies alcohol use.   REVIEW OF SYSTEMS: There is no fever or chills. There is no coughing or shortness of breath. There is no chest pain or palpitations. There is no hearing or visual changes. There are GI symptoms that have been mentioned already.   FAMILY HISTORY: Notable for his mother having small bowel resection in the past for unknown reasons.   PHYSICAL  EXAMINATION:  GENERAL: The patient is in no acute distress right now.   VITAL SIGNS: He is afebrile. His vital signs are stable.   HEENT: Normocephalic, atraumatic head. Pupils are equally reactive. Throat was clear.   NECK: Supple.   CARDIAC: Regular rhythm and rate without murmurs.   PULMONARY: Lungs are clear bilaterally.   ABDOMEN: Normoactive bowel sounds. It is soft. There is tenderness in the right lower quadrant area, but there is no rebound or guarding. He had active bowel sounds.   EXTREMITIES: No clubbing, cyanosis or edema.   SKIN: Exam is negative.   NEUROLOGICAL: Examination is nonfocal.  LABS/RADIOLOGIC STUDIES: Initially his white count was 17.7, but that has normalized. Electrolytes and liver enzymes are all normal.  CT scan did show a 3 cm intussusception involving the very distal part of the small intestine.   The patient just had a small bowel series that he just returned from. The results are pending at this time.   IMPRESSION AND RECOMMENDATION: This is a patient with CT scan evidence of intussusception associated with nausea, vomiting, diarrhea and leukocytosis. Intussusception usually occurs from conditions such as polyps, tumor, Meckel's diverticulum or possibly even Crohn's disease. Clearly, if the small bowel series is abnormal, then I would proceed with surgery since he has had this issue all his life and does not want to keep on suffering from the symptoms. If the small bowel series is unremarkable, then we could consider doing a video capsule study. Capsule study can possibly  show the cause of intussusception, although if the intussusception is resolved, it may show anything abnormal. Other potential complications of video capsule study is that the capsule can get obstructed at the site of obstruction. Then he will need to have surgery immediately. Thank you for the referral. I will continue to follow the patient.  ____________________________ Ezzard StandingPaul Y. Bluford Kaufmannh,  MD pyo:sb D: 04/14/2012 12:06:42 ET T: 04/14/2012 12:44:46 ET JOB#: [id number]  cc: Ezzard StandingPaul Y. Bluford Kaufmannh, MD, <Dictator> Wallace CullensPAUL Y Dailey Buccheri MD ELECTRONICALLY SIGNED 04/14/2012 14:43

## 2014-07-26 NOTE — Discharge Summary (Signed)
PATIENT NAME:  Marcus [name], Marcus [name] MR#:  [id number] DATE OF BIRTH:  02/04/1986  DATE OF ADMISSION:  04/12/2012 DATE OF DISCHARGE:  04/15/2012  PRINCIPAL DIAGNOSIS:  Transient short segment small intestinal intussusception.   OTHER DIAGNOSIS: None.   HOSPITAL COURSE: The patient was noted to have a 3 cm long ileoileal intussusception on CT scan on admission when he had a white blood cell count of 18,000.  This was associated with nausea, vomiting and liquid diarrhea. He had a similar episode November 26 the year before and stated that he had been having episodes like this for what he said was his whole life. He was admitted to the hospital for pain control and ultimately underwent a small bowel follow-through which was completely normal and ruled out any lead points or continued intussusceptions. Gastroenterology was consulted and capsule endoscopy was discussed with the patient again to rule out any leak points. He was discharged home without any ongoing pain and normal appetite and having bowel movements.     ____________________________ Claude MangesWilliam F. Angles Trevizo, MD wfm:ct [name]: 05/01/2012 21:32:11 ET T: 05/02/2012 09:34:18 ET JOB#: [id number]  cc: Claude MangesWilliam F. Kaikoa Magro, MD, <Dictator> Ezzard StandingPaul Y. Bluford Kaufmannh, MD Claude MangesWILLIAM F Surya Schroeter MD ELECTRONICALLY SIGNED 05/02/2012 22:17

## 2014-07-26 NOTE — H&P (Signed)
PATIENT NAME:  Marcus [name], Marcus [name] MR#:  [id number] DATE OF BIRTH:  28-Apr-1985  DATE OF ADMISSION:  04/12/2012   HISTORY OF PRESENT ILLNESS:  The patient is a 29 year old white male who was well until approximately 1:00 yesterday afternoon (13 hours ago) where he felt he was having allergic symptoms and took a Benadryl and took a nap.  At about 3:00 in the afternoon he had significant nausea, vomiting, liquid diarrhea, hot and cold spells and pain that was band-like in his lower abdomen that started in his right lower quadrant, went around to his left lower quadrant, across his flank and up his back.  He then received some Zofran and morphine and although that helped his pain and nausea, he had an immediate allergic reaction to it.  Subsequent to that, he has had 1 mg of Dilaudid and his pain is much improved.  He has not had any nausea and vomiting in the last couple of hours in the Emergency Department.   The patient had an episode similar to this on 02/29/2012, was seen by my partner, Dr. Lanelle Bal and at that time he had a normal CT scan of the abdomen.  On further questioning, he says that he has had difficulty with intermittent nausea, vomiting, liquid diarrhea and hot and cold spells for his entire life.  In addition, he states that he has a food allergy of OYSTERS.  PAST MEDICAL HISTORY:  None.   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   ALLERGIES:  FLEXERIL, MORPHINE, ZOFRAN.   MEDICATIONS:  None.   SOCIAL HISTORY:  The patient lives with his fiance and her 55-year-old stepson.  He is unemployed and smokes 1 pack of cigarettes per day and denies alcohol use.  He appears to have a somewhat contentious relationship with his fiance.   REVIEW OF SYSTEMS:   Negative for 10 systems except the gastrointestinal system as mentioned in the history of present illness.  Specifically, he does not have a  documented fever and has not had any bright red blood per rectum or mucousy stools or black tarry stools.  He has also not had any hematemesis.  FAMILY HISTORY:  Noncontributory.   PHYSICAL EXAMINATION: GENERAL:  Healthy young skinny male standing up in the Emergency Department room changing his clothes in no apparent physical distress.  He is considerably frustrated and angry, however.  Height 5 feet 10 inches, weight 150 pounds, BMI 21.5.   VITAL SIGNS:  Temperature 98.2, pulse of 95, respirations 20, blood pressure 116/55, oxygen saturation 97% on room air at rest.  HEENT:  Pupils equally round and reactive to light.  Extraocular movements intact.  Sclerae are nonicteric.  Oropharynx clear.  Mucous membranes moist.  Hearing intact to voice.  NECK:  Without tracheal deviation or jugular venous distention.  HEART:  Regular rate and rhythm with no murmurs or rubs.  LUNGS:  Clear to auscultation with normal respiratory effort bilaterally.  ABDOMEN:  Scaphoid and flat and soft.  The patient does have point tenderness in the right lower quadrant closer to the umbilicus than McBurney's point, but he has no rebound, tenderness and no guarding.  EXTREMITIES:  No edema with normal capillary refill bilaterally.  NEUROLOGIC:  Cranial nerves II-XII, motor and sensation grossly intact.  PSYCHIATRIC:  Alert and oriented x 4 with a frustrated and somewhat angry affect.    LABORATORY DATA:  White blood cell count 17.7, hematocrit  51.7%, platelet count normal.  Electrolytes, lipase and hepatic profile normal.  CT scan of the abdomen and pelvis with IV and oral contrast shows a 3 cm intussusception that is ileoileal in the right lower quadrant and is otherwise normal.   ASSESSMENT:  Very short segment ileoileal intussusception with leukocytosis and nausea, vomiting, liquid diarrhea.  The patient had a white blood cell count of over 20,000 when he was seen November 26 and has been having episodes like this for what  he says is "his whole life."    PLAN:  Admit to hospital for IV fluid hydration and observation and a gradual advancement of his diet.  I explained to the patient that since the locations of his intussusception may be varying  over time and this episode which was documented by CT scan is such a short segment that if he improves there is no indication for surgical intervention. Once he improves he may benefit from a small bowel workup to include enteroclysis and possible small bowel endoscopic camera to look for a recurring lead point.   ____________________________ Claude MangesWilliam F. Linnaea Ahn, MD w[facility]:ea D: 04/12/2012 02:02:52 ET T: 04/12/2012 03:01:48 ET JOB#: [id number]  cc: Claude MangesWilliam F. Mabrey Howland, MD, <Dictator> Claude MangesWilliam F. Kandy Towery, MD Claude MangesWILLIAM F Liandro Thelin MD ELECTRONICALLY SIGNED 04/13/2012 19:40

## 2014-07-26 NOTE — Consult Note (Signed)
Full consult to follow. Recurrent bouts of severe RLQ pain assoc with N/V/D since childhood. Told in the past that he had viral gastroenteritis. CT in 11/13 for above sxs was neg. Admitted again with similar sxs. This time, however, CT suggests intussusception affecting distal small intestine, either from tumor, Crohn's disease, Meckel's, polyp, etc. Mother had SB resected for unknown reasons. Feels better but still has abd pain. Area tender to touch. Just returned from SB series. Results pending. If SB series clearly abnormal, favor proceeding to surgery. Pt does not want to deal with these issues in the future. If SB series unremarkable, then will discuss with patient about ordering video capsule study.Will need to find out from insurance if video capsule study can be arranged as inpt or outpt. Due to lack of nursing staff, doubt we can arrange over the weekend. Will follow. Thanks.     Electronic Signatures: Lutricia Feilh, Adrielle Polakowski (MD) (Signed on 10-Jan-14 11:59)  Authored   Last Updated: 10-Jan-14 14:41 by Lutricia Feilh, Allee Busk (MD)
